# Patient Record
Sex: Female | Born: 1960 | Race: White | Hispanic: No | Marital: Married | Smoking: Never smoker
Health system: Southern US, Community
[De-identification: ages and names within clinical notes are randomized; demographics above are authoritative.]

## PROBLEM LIST (undated history)

## (undated) HISTORY — PX: SHOULDER SURGERY: SHX246

## (undated) HISTORY — PX: KNEE SURGERY: SHX244

## (undated) HISTORY — PX: FRACTURE SURGERY: SHX138

## (undated) HISTORY — PX: BREAST BIOPSY: SHX20

---

## 2004-11-30 ENCOUNTER — Ambulatory Visit: Payer: Self-pay | Admitting: Family Medicine

## 2006-02-18 ENCOUNTER — Ambulatory Visit: Payer: Self-pay | Admitting: Family Medicine

## 2007-05-26 ENCOUNTER — Ambulatory Visit: Payer: Self-pay | Admitting: Family Medicine

## 2007-05-29 ENCOUNTER — Ambulatory Visit: Payer: Self-pay | Admitting: Family Medicine

## 2008-06-03 ENCOUNTER — Ambulatory Visit: Payer: Self-pay | Admitting: Family Medicine

## 2009-06-06 ENCOUNTER — Ambulatory Visit: Payer: Self-pay | Admitting: Family Medicine

## 2010-07-12 ENCOUNTER — Ambulatory Visit: Payer: Self-pay | Admitting: Family Medicine

## 2010-07-17 ENCOUNTER — Ambulatory Visit: Payer: Self-pay | Admitting: Family Medicine

## 2010-08-02 ENCOUNTER — Ambulatory Visit: Payer: Self-pay | Admitting: Urology

## 2010-11-27 ENCOUNTER — Ambulatory Visit: Payer: Self-pay | Admitting: Internal Medicine

## 2011-08-27 ENCOUNTER — Ambulatory Visit: Payer: Self-pay | Admitting: Urology

## 2011-09-11 ENCOUNTER — Ambulatory Visit: Payer: Self-pay

## 2012-09-12 ENCOUNTER — Ambulatory Visit: Payer: Self-pay | Admitting: Nurse Practitioner

## 2012-09-16 ENCOUNTER — Ambulatory Visit: Payer: Self-pay | Admitting: Nurse Practitioner

## 2012-10-23 ENCOUNTER — Ambulatory Visit: Payer: Self-pay | Admitting: Surgery

## 2012-10-31 ENCOUNTER — Ambulatory Visit: Payer: Self-pay | Admitting: Surgery

## 2013-02-25 ENCOUNTER — Ambulatory Visit: Payer: Self-pay | Admitting: Surgery

## 2013-09-17 ENCOUNTER — Ambulatory Visit: Payer: Self-pay | Admitting: Nurse Practitioner

## 2014-08-03 ENCOUNTER — Ambulatory Visit: Payer: Self-pay | Admitting: Podiatry

## 2014-09-20 ENCOUNTER — Ambulatory Visit: Payer: Self-pay | Admitting: Nurse Practitioner

## 2014-12-10 NOTE — Op Note (Signed)
PATIENT NAME:  Neysa BonitoMORY, Raeghan G MR#:  [id number] DATE OF BIRTH:  07-30-1961  DATE OF PROCEDURE:  10/31/2012  PREOPERATIVE DIAGNOSIS: Left breast mass.   POSTOPERATIVE DIAGNOSIS: Left breast mass.   PROCEDURE: Excision of left breast mass.   SURGEON: Adella HareJ. Wilton Smith, MD   ANESTHESIA: General.   INDICATIONS: This 54 year old female recently had ultrasound findings of approximately 7 mm hypoechoic nodule at the 1 o'clock position, retroareolar portion of the left breast, and excisional biopsy was recommended for further study. She did have preoperative ultrasound-guided insertion of Kopans wire. The postprocedure mammogram images were reviewed  PROCEDURE: The patient was placed on the operating table in the supine position under general anesthesia. The dressing was removed from the left breast, exposing the Kopans wire, which was cut 2 cm from the skin. The breast was prepared with ChloraPrep and draped in a sterile manner.   A curvilinear incision was made at the border of the areola from approximately 12 o'clock to 2 o'clock position and carried down through subcutaneous tissues. Several small bleeding points were cauterized. Dissection was carried down to encounter the wire and particularly the thick portion of the wire. A sample of tissue surrounding the distal end of the thick portion of the wire was done. This portion of tissue was approximately 1.5 x 1.5 x 1.5 cm in dimension. There was some mild firmness within the tissue, and it was submitted in formalin for routine pathology. It is noted that during the course of the procedure, a number of small bleeding points were cauterized. One bleeding point medially was suture ligated with 4-0 chromic. The tissues were infiltrated with 0.5% Sensorcaine with epinephrine. Hemostasis was subsequently intact. The wound was closed with a running 4-0 chromic subcuticular suture and Dermabond. The patient tolerated surgery satisfactorily and was then prepared for  transfer to the recovery room.     ____________________________ Shela CommonsJ. Renda RollsWilton Smith, MD jws:OSi D: 10/31/2012 12:36:44 ET T: 11/01/2012 09:47:24 ET JOB#: [id number]  cc: Adella HareJ. Wilton Smith, MD, <Dictator> Adella HareWILTON J SMITH MD ELECTRONICALLY SIGNED 11/02/2012 21:22

## 2016-03-30 ENCOUNTER — Other Ambulatory Visit: Payer: Self-pay | Admitting: Nurse Practitioner

## 2016-03-30 DIAGNOSIS — Z1231 Encounter for screening mammogram for malignant neoplasm of breast: Secondary | ICD-10-CM

## 2016-04-13 ENCOUNTER — Other Ambulatory Visit: Payer: Self-pay | Admitting: Nurse Practitioner

## 2016-04-13 ENCOUNTER — Ambulatory Visit
Admission: RE | Admit: 2016-04-13 | Discharge: 2016-04-13 | Disposition: A | Discharge status: Home / Self Care | Payer: BC Managed Care – PPO | Source: Ambulatory Visit | Attending: Nurse Practitioner | Admitting: Nurse Practitioner

## 2016-04-13 DIAGNOSIS — Z1231 Encounter for screening mammogram for malignant neoplasm of breast: Secondary | ICD-10-CM

## 2017-01-15 ENCOUNTER — Encounter: Payer: Self-pay | Admitting: *Deleted

## 2017-01-15 ENCOUNTER — Ambulatory Visit
Admission: EM | Admit: 2017-01-15 | Discharge: 2017-01-15 | Disposition: A | Discharge status: Home / Self Care | Payer: BC Managed Care – PPO | Attending: Family Medicine | Admitting: Family Medicine

## 2017-01-15 DIAGNOSIS — L299 Pruritus, unspecified: Secondary | ICD-10-CM | POA: Diagnosis not present

## 2017-01-15 DIAGNOSIS — L247 Irritant contact dermatitis due to plants, except food: Secondary | ICD-10-CM | POA: Diagnosis not present

## 2017-01-15 MED ORDER — LORATADINE 10 MG PO TABS: 10.0000 mg | ORAL_TABLET | Freq: Every day | ORAL | 0 refills | Status: AC

## 2017-01-15 MED ORDER — PREDNISONE 10 MG (21) PO TBPK: ORAL_TABLET | ORAL | 0 refills | Status: DC

## 2017-01-15 MED ORDER — CETIRIZINE HCL 10 MG PO TABS: 10.0000 mg | ORAL_TABLET | Freq: Every day | ORAL | 0 refills | Status: AC

## 2017-01-15 NOTE — ED Triage Notes (Signed)
Patient was exposed to poison ivy 1 week ago. OTC medications have not resolved symptoms. Blisters are visible on lower arms and ankles bilateral.

## 2017-01-15 NOTE — ED Provider Notes (Signed)
[facility]    CSN: [id number] Arrival date & time: 01/15/17  0809   Patient   History   Chief Complaint Chief Complaint  Patient presents with  . Poison Ivy    HPI Donna Glover is a 56 y.o. female.   Issue circular rash states rash started last week she thinks is poison oak is on mostly her left arm a few lesions on her right hand and right wrist and on both lower legs. She states very pruritic she's been scratching it and she is quite concerned. She took Benadryl 50 mg last night slept for 2 hours. Pro-she does not have this too often. She's had multiple surgeries tubal ligation knee and shoulder surgery. She has no drug allergies and no chronic medical problems is a history of breast cancer diabetes in the family.   The history is provided by the patient. No language interpreter was used.  Poison [place]  This is a new problem. The current episode started more than 1 week ago. The problem occurs constantly. The problem has been gradually worsening. Pertinent negatives include no chest pain, no abdominal pain, no headaches and no shortness of breath. Nothing aggravates the symptoms. She has tried nothing for the symptoms. The treatment provided no relief.    History reviewed. No pertinent past medical history.  There are no active problems to display for this patient.   Past Surgical History:  Procedure Laterality Date  . BREAST BIOPSY Left    neg  . FRACTURE SURGERY    . KNEE SURGERY Right   . SHOULDER SURGERY Left     OB History    No data available       Home Medications    Prior to Admission medications   Medication Sig Start Date End Date Taking? Authorizing Provider  cetirizine (ZYRTEC) 10 MG tablet Take 1 tablet (10 mg total) by mouth daily. If needed at night for itching not relieved by Claritin in the morning. 01/15/17   Hassan Rowan, MD  loratadine (CLARITIN) 10 MG tablet Take 1 tablet (10 mg total) by mouth daily. Take 1 tablet in the morning.  As needed for itching. 01/15/17   Hassan Rowan, MD  predniSONE (STERAPRED UNI-PAK 21 TAB) 10 MG (21) TBPK tablet 6 tabs day 1 and 2, 5 tabs day 3 and 4, 4 tabs day 5 and 6, 3 tabs day 7 and 8, 2 tabs day 9 and 10, 1 tab day 11 and 12. Take orally 01/15/17   Hassan Rowan, MD    Family History Family History  Problem Relation Age of Onset  . Breast cancer Paternal Grandmother     Social History Social History  Substance Use Topics  . Smoking status: Never Smoker  . Smokeless tobacco: Never Used  . Alcohol use No     Allergies   Patient has no known allergies.   Review of Systems Review of Systems  Respiratory: Negative for shortness of breath.   Cardiovascular: Negative for chest pain.  Gastrointestinal: Negative for abdominal pain.  Skin: Positive for rash.  Neurological: Negative for headaches.  All other systems reviewed and are negative.    Physical Exam Triage Vital Signs ED Triage Vitals  Enc Vitals Group     BP 01/15/17 0829 116/69     Pulse Rate 01/15/17 0829 94     Resp 01/15/17 0829 16     Temp 01/15/17 0829 97.9 F (36.6 C)     Temp Source 01/15/17 0829 Oral  SpO2 01/15/17 0829 100 %     Weight 01/15/17 0830 160 lb (72.6 kg)     Height 01/15/17 0830 5' (1.524 m)     Head Circumference --      Peak Flow --      Pain Score 01/15/17 0830 0     Pain Loc --      Pain Edu? --      Excl. in GC? --    No data found.   Updated Vital Signs BP 116/69 (BP Location: Left Arm)   Pulse 94   Temp 97.9 F (36.6 C) (Oral)   Resp 16   Ht 5' (1.524 m)   Wt 160 lb (72.6 kg)   SpO2 100%   BMI 31.25 kg/m   Visual Acuity Right Eye Distance:   Left Eye Distance:   Bilateral Distance:    Right Eye Near:   Left Eye Near:    Bilateral Near:     Physical Exam  Constitutional: She is oriented to person, place, and time. She appears well-developed and well-nourished.  HENT:  Head: Normocephalic and atraumatic.  Eyes: EOM are normal. Pupils are equal,  round, and reactive to light.  Neck: Normal range of motion.  Pulmonary/Chest: Effort normal.  Musculoskeletal: Normal range of motion.  Neurological: She is alert and oriented to person, place, and time.  Skin: Rash noted. There is erythema.     Psychiatric: She has a normal mood and affect. Her behavior is normal.  Vitals reviewed.    UC Treatments / Results  Labs (all labs ordered are listed, but only abnormal results are displayed) Labs Reviewed - No data to display  EKG  EKG Interpretation None       Radiology No results found.  Procedures Procedures (including critical care time)  Medications Ordered in UC Medications - No data to display   Initial Impression / Assessment and Plan / UC Course  I have reviewed the triage vital signs and the nursing notes.  Pertinent labs & imaging results that were available during my care of the patient were reviewed by me and considered in my medical decision making (see chart for details). We will place patient on 12 day course of prednisone and Claritin the morning 10 mg Zyrtec 10 mg at night also gave her offered her a work note which she declined since she retired. Also since low concerned it may be 2 different etiology both poison ivy and something else causing the pruritus but will first treat the contact dermatitis    Final Clinical Impressions(s) / UC Diagnoses   Final diagnoses:  Irritant contact dermatitis due to plants, except food  Itching    New Prescriptions New Prescriptions   CETIRIZINE (ZYRTEC) 10 MG TABLET    Take 1 tablet (10 mg total) by mouth daily. If needed at night for itching not relieved by Claritin in the morning.   LORATADINE (CLARITIN) 10 MG TABLET    Take 1 tablet (10 mg total) by mouth daily. Take 1 tablet in the morning. As needed for itching.   PREDNISONE (STERAPRED UNI-PAK 21 TAB) 10 MG (21) TBPK TABLET    6 tabs day 1 and 2, 5 tabs day 3 and 4, 4 tabs day 5 and 6, 3 tabs day 7 and 8, 2  tabs day 9 and 10, 1 tab day 11 and 12. Take orally     Note: This dictation was prepared with Dragon dictation along with smaller phrase technology. Any transcriptional errors that  result from this process are unintentional.   Hassan RowanWade, Mireya Meditz, MD 01/15/17 [phone]

## 2017-03-06 ENCOUNTER — Other Ambulatory Visit: Payer: Self-pay | Admitting: Nurse Practitioner

## 2017-03-06 DIAGNOSIS — Z1231 Encounter for screening mammogram for malignant neoplasm of breast: Secondary | ICD-10-CM

## 2017-04-15 ENCOUNTER — Ambulatory Visit
Admission: RE | Admit: 2017-04-15 | Discharge: 2017-04-15 | Disposition: A | Discharge status: Home / Self Care | Payer: BC Managed Care – PPO | Source: Ambulatory Visit | Attending: Nurse Practitioner | Admitting: Nurse Practitioner

## 2017-04-15 DIAGNOSIS — Z1231 Encounter for screening mammogram for malignant neoplasm of breast: Secondary | ICD-10-CM | POA: Diagnosis present

## 2017-04-29 ENCOUNTER — Ambulatory Visit
Admission: EM | Admit: 2017-04-29 | Discharge: 2017-04-29 | Disposition: A | Discharge status: Home / Self Care | Payer: BC Managed Care – PPO | Attending: Family Medicine | Admitting: Family Medicine

## 2017-04-29 DIAGNOSIS — S39012A Strain of muscle, fascia and tendon of lower back, initial encounter: Secondary | ICD-10-CM

## 2017-04-29 MED ORDER — CYCLOBENZAPRINE HCL 10 MG PO TABS: 10.0000 mg | ORAL_TABLET | Freq: Every day | ORAL | 0 refills | Status: DC

## 2017-04-29 MED ORDER — HYDROCODONE-ACETAMINOPHEN 5-325 MG PO TABS: ORAL_TABLET | ORAL | 0 refills | Status: AC

## 2017-04-29 NOTE — ED Triage Notes (Signed)
Patient complains of left lower back pain that started after picking up a big tote. Patient states that she pulled something in this area and required assistance standing back up.

## 2017-04-29 NOTE — ED Provider Notes (Signed)
[facility]    CSN: [id number] Arrival date & time: 04/29/17  1619     History   Chief Complaint Chief Complaint  Patient presents with  . Back Pain    left    HPI Donna Glover is a 56 y.o. female.   56 yo female with a c/o left lower back pain that started today when she was picking up a heavy tote. States she felt like something pulled in the area. Denies pain radiating or any numbness/tingling, bowel or bladder problems.    The history is provided by the patient.    History reviewed. No pertinent past medical history.  There are no active problems to display for this patient.   Past Surgical History:  Procedure Laterality Date  . BREAST BIOPSY Left    neg  . FRACTURE SURGERY    . KNEE SURGERY Right   . SHOULDER SURGERY Left     OB History    No data available       Home Medications    Prior to Admission medications   Medication Sig Start Date End Date Taking? Authorizing Provider  cetirizine (ZYRTEC) 10 MG tablet Take 1 tablet (10 mg total) by mouth daily. If needed at night for itching not relieved by Claritin in the morning. 01/15/17   Hassan Rowan, MD  cyclobenzaprine (FLEXERIL) 10 MG tablet Take 1 tablet (10 mg total) by mouth at bedtime. 04/29/17   Payton Mccallum, MD  HYDROcodone-acetaminophen (NORCO/VICODIN) 5-325 MG tablet 1-2 tabs po qd prn 04/29/17   Payton Mccallum, MD  loratadine (CLARITIN) 10 MG tablet Take 1 tablet (10 mg total) by mouth daily. Take 1 tablet in the morning. As needed for itching. 01/15/17   Hassan Rowan, MD  predniSONE (STERAPRED UNI-PAK 21 TAB) 10 MG (21) TBPK tablet 6 tabs day 1 and 2, 5 tabs day 3 and 4, 4 tabs day 5 and 6, 3 tabs day 7 and 8, 2 tabs day 9 and 10, 1 tab day 11 and 12. Take orally 01/15/17   Hassan Rowan, MD    Family History Family History  Problem Relation Age of Onset  . Breast cancer Paternal Grandmother     Social History Social History  Substance Use Topics  . Smoking status: Never Smoker   . Smokeless tobacco: Never Used  . Alcohol use No     Allergies   Patient has no known allergies.   Review of Systems Review of Systems   Physical Exam Triage Vital Signs ED Triage Vitals  Enc Vitals Group     BP 04/29/17 1727 132/73     Pulse Rate 04/29/17 1727 66     Resp 04/29/17 1727 16     Temp 04/29/17 1727 98.2 F (36.8 C)     Temp Source 04/29/17 1727 Oral     SpO2 04/29/17 1727 100 %     Weight 04/29/17 1726 160 lb (72.6 kg)     Height 04/29/17 1726 5' (1.524 m)     Head Circumference --      Peak Flow --      Pain Score 04/29/17 1726 6     Pain Loc --      Pain Edu? --      Excl. in GC? --    No data found.   Updated Vital Signs BP 132/73 (BP Location: Left Arm)   Pulse 66   Temp 98.2 F (36.8 C) (Oral)   Resp 16   Ht 5' (1.524  m)   Wt 160 lb (72.6 kg)   SpO2 100%   BMI 31.25 kg/m   Visual Acuity Right Eye Distance:   Left Eye Distance:   Bilateral Distance:    Right Eye Near:   Left Eye Near:    Bilateral Near:     Physical Exam  Constitutional: She appears well-developed and well-nourished. No distress.  Musculoskeletal:       Lumbar back: She exhibits tenderness (left lumbar paraspinous muscles). She exhibits normal range of motion, no bony tenderness, no swelling, no edema, no deformity, no laceration, no pain, no spasm and normal pulse.  Skin: She is not diaphoretic.  Nursing note and vitals reviewed.    UC Treatments / Results  Labs (all labs ordered are listed, but only abnormal results are displayed) Labs Reviewed - No data to display  EKG  EKG Interpretation None       Radiology No results found.  Procedures Procedures (including critical care time)  Medications Ordered in UC Medications - No data to display   Initial Impression / Assessment and Plan / UC Course  I have reviewed the triage vital signs and the nursing notes.  Pertinent labs & imaging results that were available during my care of the patient  were reviewed by me and considered in my medical decision making (see chart for details).       Final Clinical Impressions(s) / UC Diagnoses   Final diagnoses:  Strain of lumbar region, initial encounter    New Prescriptions Discharge Medication List as of 04/29/2017  5:44 PM    START taking these medications   Details  cyclobenzaprine (FLEXERIL) 10 MG tablet Take 1 tablet (10 mg total) by mouth at bedtime., Starting Mon 04/29/2017, Normal    HYDROcodone-acetaminophen (NORCO/VICODIN) 5-325 MG tablet 1-2 tabs po qd prn, Print       1. diagnosis reviewed with patient 2. rx as per orders above; reviewed possible side effects, interactions, risks and benefits  3. Recommend supportive treatment with rest, heat, gentle stretching 4. Follow-up prn if symptoms worsen or don't improve  Controlled Substance Prescriptions [place] Controlled Substance Registry consulted? No   Payton Mccallumonty, Mcclellan Demarais, MD 04/29/17 1950

## 2018-07-15 ENCOUNTER — Other Ambulatory Visit: Payer: Self-pay | Admitting: Nurse Practitioner

## 2018-07-15 DIAGNOSIS — Z1231 Encounter for screening mammogram for malignant neoplasm of breast: Secondary | ICD-10-CM

## 2018-08-28 ENCOUNTER — Ambulatory Visit
Admission: RE | Admit: 2018-08-28 | Discharge: 2018-08-28 | Disposition: A | Discharge status: Home / Self Care | Payer: BC Managed Care – PPO | Source: Ambulatory Visit | Attending: Nurse Practitioner | Admitting: Nurse Practitioner

## 2018-08-28 DIAGNOSIS — Z1231 Encounter for screening mammogram for malignant neoplasm of breast: Secondary | ICD-10-CM | POA: Diagnosis present

## 2019-07-20 ENCOUNTER — Other Ambulatory Visit: Payer: Self-pay | Admitting: Nurse Practitioner

## 2019-07-20 DIAGNOSIS — Z1231 Encounter for screening mammogram for malignant neoplasm of breast: Secondary | ICD-10-CM

## 2019-09-03 ENCOUNTER — Ambulatory Visit
Admission: RE | Admit: 2019-09-03 | Discharge: 2019-09-03 | Disposition: A | Discharge status: Home / Self Care | Payer: BC Managed Care – PPO | Source: Ambulatory Visit | Attending: Nurse Practitioner | Admitting: Nurse Practitioner

## 2019-09-03 DIAGNOSIS — Z1231 Encounter for screening mammogram for malignant neoplasm of breast: Secondary | ICD-10-CM

## 2020-07-20 ENCOUNTER — Other Ambulatory Visit: Payer: Self-pay | Admitting: Nurse Practitioner

## 2020-07-20 DIAGNOSIS — Z1231 Encounter for screening mammogram for malignant neoplasm of breast: Secondary | ICD-10-CM

## 2020-09-07 ENCOUNTER — Other Ambulatory Visit: Payer: Self-pay

## 2020-09-07 ENCOUNTER — Ambulatory Visit
Admission: RE | Admit: 2020-09-07 | Discharge: 2020-09-07 | Disposition: A | Discharge status: Home / Self Care | Payer: BC Managed Care – PPO | Source: Ambulatory Visit | Attending: Nurse Practitioner | Admitting: Nurse Practitioner

## 2020-09-07 DIAGNOSIS — Z1231 Encounter for screening mammogram for malignant neoplasm of breast: Secondary | ICD-10-CM | POA: Insufficient documentation

## 2021-07-20 ENCOUNTER — Other Ambulatory Visit: Payer: Self-pay | Admitting: Nurse Practitioner

## 2021-07-20 DIAGNOSIS — Z1231 Encounter for screening mammogram for malignant neoplasm of breast: Secondary | ICD-10-CM

## 2021-08-23 LAB — EXTERNAL GENERIC LAB PROCEDURE: COLOGUARD: NEGATIVE

## 2021-08-23 LAB — COLOGUARD: COLOGUARD: NEGATIVE

## 2021-10-09 ENCOUNTER — Ambulatory Visit
Admission: RE | Admit: 2021-10-09 | Discharge: 2021-10-09 | Disposition: A | Discharge status: Home / Self Care | Payer: BC Managed Care – PPO | Source: Ambulatory Visit | Attending: Nurse Practitioner | Admitting: Nurse Practitioner

## 2021-10-09 ENCOUNTER — Other Ambulatory Visit: Payer: Self-pay

## 2021-10-09 DIAGNOSIS — Z1231 Encounter for screening mammogram for malignant neoplasm of breast: Secondary | ICD-10-CM | POA: Insufficient documentation

## 2021-12-25 IMAGING — MG DIGITAL SCREENING BILAT W/ TOMO W/ CAD
8 series · 8 of 24 positions shown · non-contrast
Comparison: Previous exam(s).

CLINICAL DATA: Screening.

EXAM:
DIGITAL SCREENING BILATERAL MAMMOGRAM WITH TOMO AND CAD

[L MLO synth-2D]
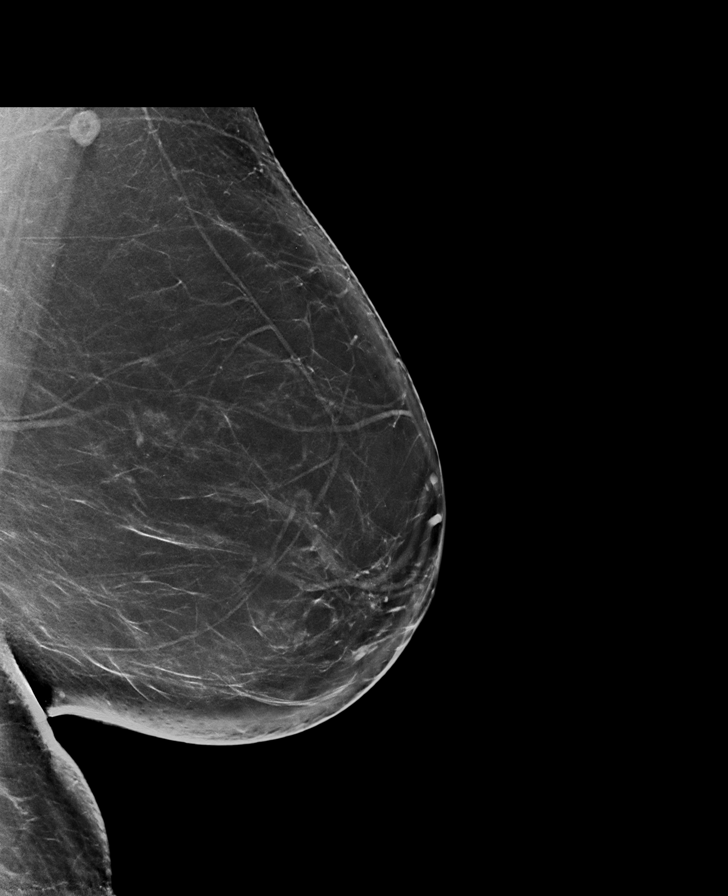

[R MLO synth-2D]
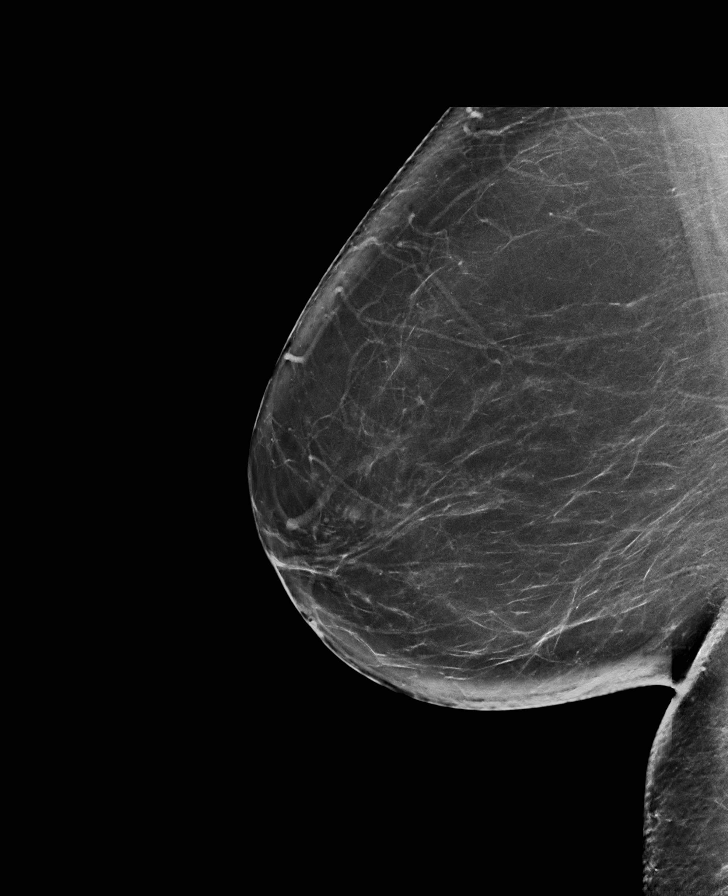

[R CC synth-2D]
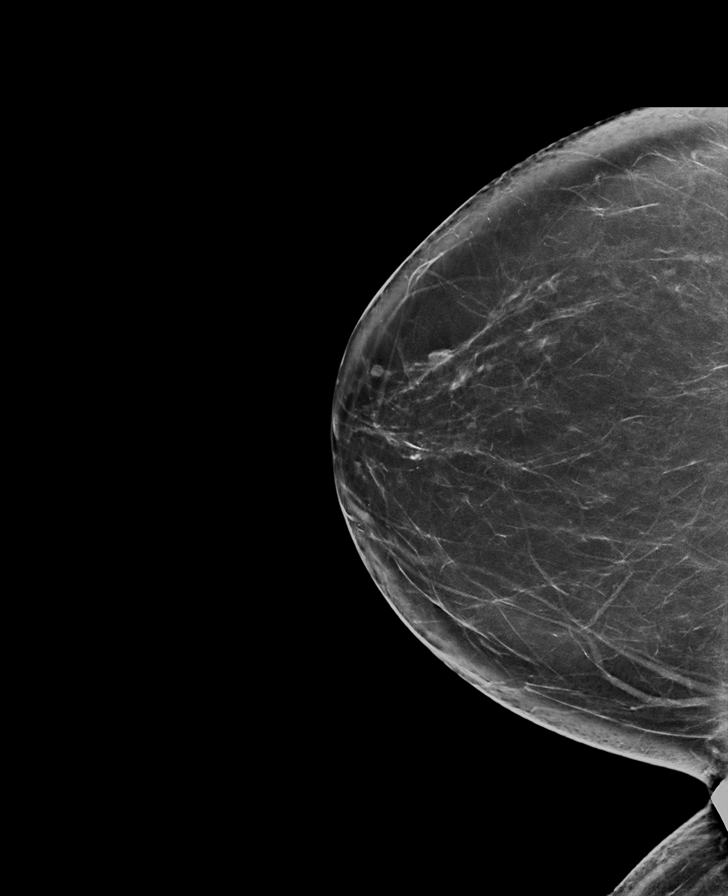

[L CC synth-2D]
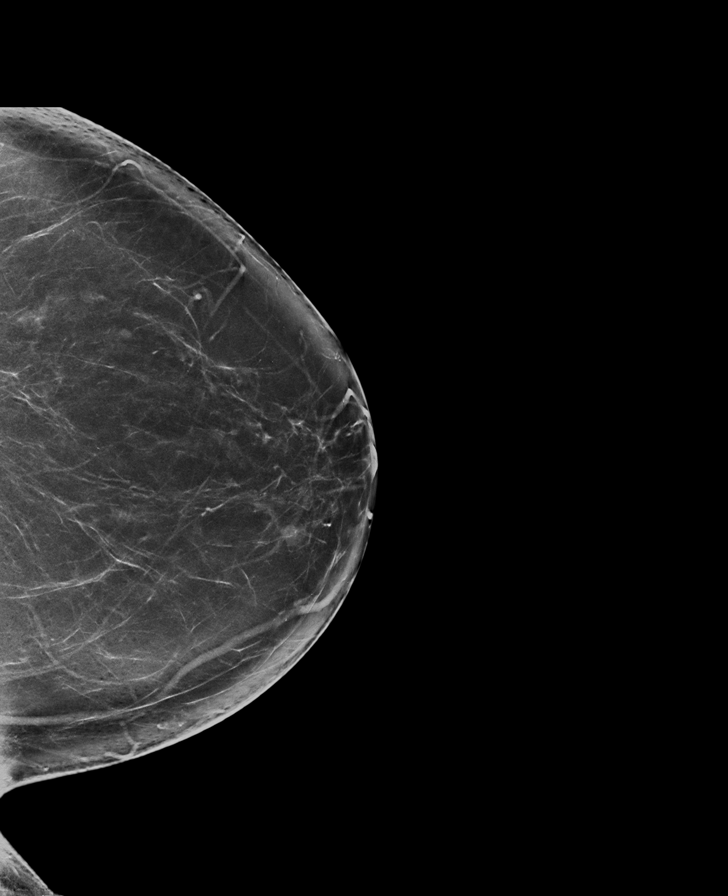

[R CC tomo · tomo slice 43/85.0]
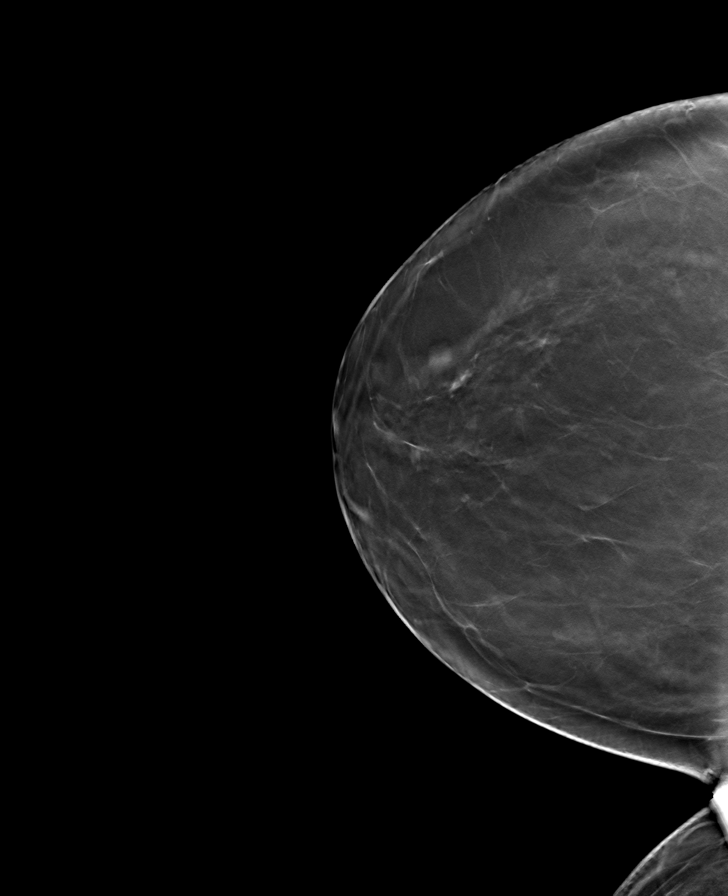

[L CC tomo · tomo slice 43/86.0]
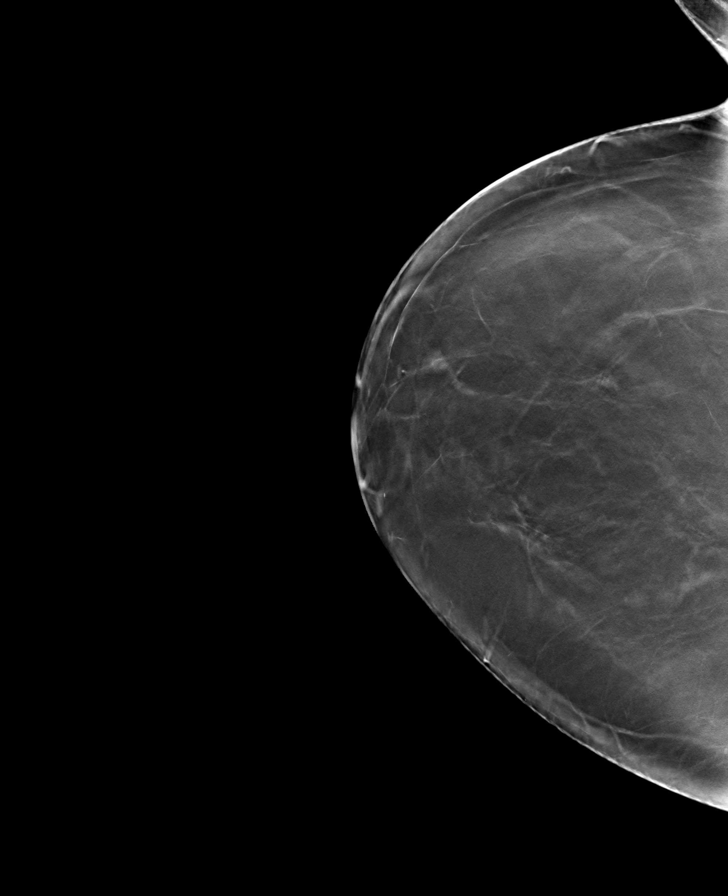

[L MLO tomo · tomo slice 45/89.0]
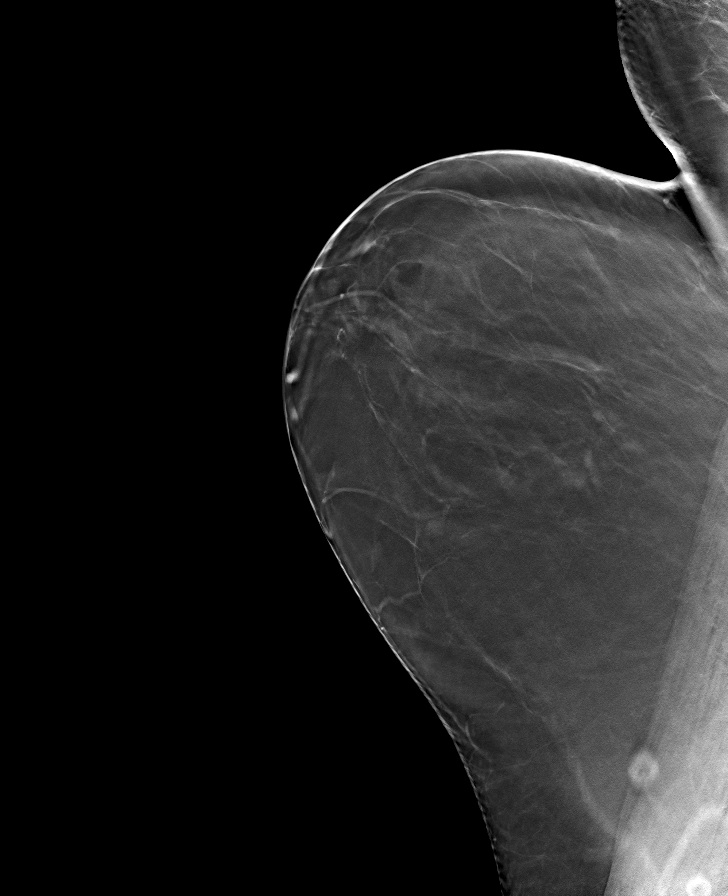

[R MLO tomo · tomo slice 46/91.0]
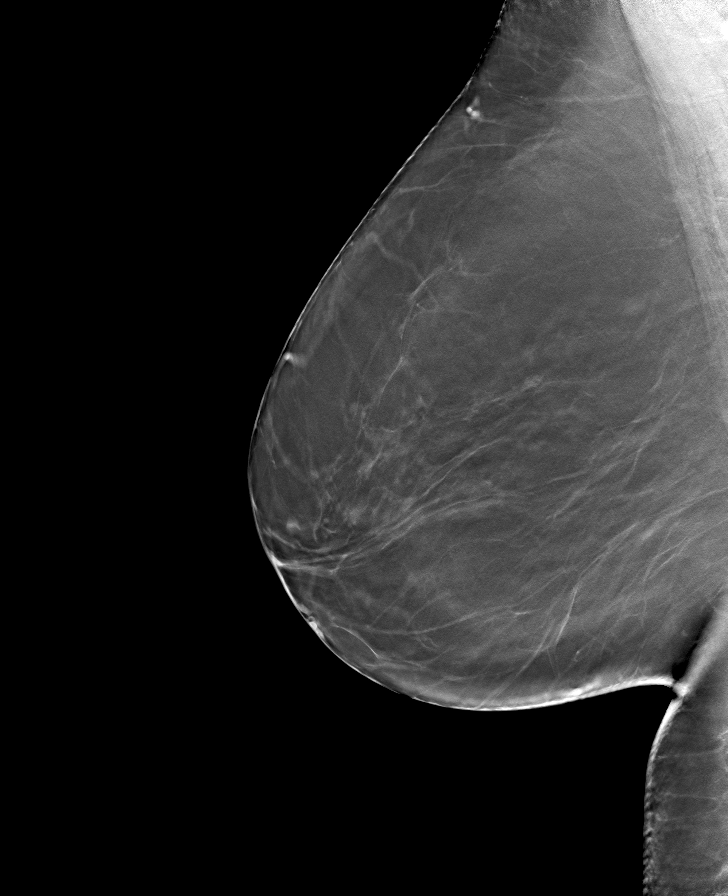

[8 of 24 positions shown; findings below may reference images not displayed]

ACR Breast Density Category b: There are scattered areas of
fibroglandular density.
FINDINGS: There are no findings suspicious for malignancy. Images were
processed with CAD.
IMPRESSION: No mammographic evidence of malignancy. A result letter of this
screening mammogram will be mailed directly to the patient.

RECOMMENDATION:
Screening mammogram in one year. (Code:[id number]-775)

BI-RADS CATEGORY  1: Negative.

## 2022-07-24 ENCOUNTER — Other Ambulatory Visit: Payer: Self-pay | Admitting: Nurse Practitioner

## 2022-07-24 DIAGNOSIS — Z1231 Encounter for screening mammogram for malignant neoplasm of breast: Secondary | ICD-10-CM

## 2022-10-10 ENCOUNTER — Ambulatory Visit
Admission: RE | Admit: 2022-10-10 | Discharge: 2022-10-10 | Disposition: A | Discharge status: Home / Self Care | Payer: BC Managed Care – PPO | Source: Ambulatory Visit | Attending: Nurse Practitioner | Admitting: Nurse Practitioner

## 2022-10-10 DIAGNOSIS — Z1231 Encounter for screening mammogram for malignant neoplasm of breast: Secondary | ICD-10-CM | POA: Insufficient documentation

## 2022-10-12 ENCOUNTER — Ambulatory Visit (INDEPENDENT_AMBULATORY_CARE_PROVIDER_SITE_OTHER): Payer: BC Managed Care – PPO

## 2022-10-12 ENCOUNTER — Ambulatory Visit
Admission: EM | Admit: 2022-10-12 | Discharge: 2022-10-12 | Disposition: A | Discharge status: Home / Self Care | Payer: BC Managed Care – PPO | Attending: Family Medicine | Admitting: Family Medicine

## 2022-10-12 DIAGNOSIS — M79671 Pain in right foot: Secondary | ICD-10-CM | POA: Diagnosis not present

## 2022-10-12 NOTE — ED Provider Notes (Signed)
[facility]    CSN: [id number] Arrival date & time: 10/12/22  1344      History   Chief Complaint Chief Complaint  Patient presents with   Foot Pain    HPI  HPI Donna Glover is a 62 y.o. female.   Donna Glover presents for right foot pain.  Reports about a month ago she was walking down the stairs twisted her right ankle.  She had immediate knee pain but then afterwards she started having right foot pain.  The foot pain is on the lateral midfoot.  Pain is described as "compression-like."  She took some ketorolac this morning which helped her symptoms.  Pain does not radiate into her ankle or her leg.  She does not recall any abnormal pops or sounds.  Pain is worse with walking.  She has previously fractured her ankle on that side.   History reviewed. No pertinent past medical history.  There are no problems to display for this patient.   Past Surgical History:  Procedure Laterality Date   BREAST BIOPSY Left    neg   FRACTURE SURGERY     KNEE SURGERY Right    SHOULDER SURGERY Left     OB History   No obstetric history on file.      Home Medications    Prior to Admission medications   Medication Sig Start Date End Date Taking? Authorizing Provider  ketorolac (TORADOL) 10 MG tablet Take 10 mg by mouth every 6 (six) hours as needed.   Yes [provider]  CALCIUM-VITAMIN D PO Take 1 tablet by mouth daily.    [provider]  cetirizine (ZYRTEC) 10 MG tablet Take 1 tablet (10 mg total) by mouth daily. If needed at night for itching not relieved by Claritin in the morning. 01/15/17   Frederich Cha, MD  cyclobenzaprine (FLEXERIL) 10 MG tablet Take 1 tablet (10 mg total) by mouth at bedtime. 04/29/17   Norval Gable, MD  HYDROcodone-acetaminophen (NORCO/VICODIN) 5-325 MG tablet 1-2 tabs po qd prn 04/29/17   Norval Gable, MD  loratadine (CLARITIN) 10 MG tablet Take 1 tablet (10 mg total) by mouth daily. Take 1 tablet in the morning. As needed for  itching. 01/15/17   Frederich Cha, MD  predniSONE (STERAPRED UNI-PAK 21 TAB) 10 MG (21) TBPK tablet 6 tabs day 1 and 2, 5 tabs day 3 and 4, 4 tabs day 5 and 6, 3 tabs day 7 and 8, 2 tabs day 9 and 10, 1 tab day 11 and 12. Take orally 01/15/17   Frederich Cha, MD    Family History Family History  Problem Relation Age of Onset   Breast cancer Paternal Grandmother     Social History Social History   Tobacco Use   Smoking status: Never   Smokeless tobacco: Never  Vaping Use   Vaping Use: Never used  Substance Use Topics   Alcohol use: No   Drug use: No     Allergies   Patient has no known allergies.   Review of Systems Review of Systems: :negative unless otherwise stated in HPI.      Physical Exam Triage Vital Signs ED Triage Vitals  Enc Vitals Group     BP      Pulse      Resp      Temp      Temp src      SpO2      Weight      Height  Head Circumference      Peak Flow      Pain Score      Pain Loc      Pain Edu?      Excl. in [place]?    No data found.  Updated Vital Signs BP 128/82 (BP Location: Left Arm)   Pulse 70   Temp 98 F (36.7 C) (Oral)   Ht '4\' 11"'$  (1.499 m)   Wt 90.7 kg   SpO2 94%   BMI 40.40 kg/m   Visual Acuity Right Eye Distance:   Left Eye Distance:   Bilateral Distance:    Right Eye Near:   Left Eye Near:    Bilateral Near:     Physical Exam GEN: well appearing female in no acute distress  CVS: well perfused  RESP: speaking in full sentences without pause, no respiratory distress  MSK:   Ankle/Foot, Right: TTP noted at the 4th base metatarsal extended towards the cuboid. No visible erythema, swelling, ecchymosis, or bony deformity. Transverse arch grossly intact; No evidence of tibiotalar deviation; Range of motion is full in all directions. Strength is 5/5 in all directions. No tenderness at the insertion/body/myotendinous junction of the Achilles tendon;  No tenderness on posterior aspects of lateral and medial malleolus; Talar  dome nontender; Unremarkable calcaneal squeeze; No plantar calcaneal tenderness; No tenderness over the navicular prominence; Able to walk 4 steps.     UC Treatments / Results  Labs (all labs ordered are listed, but only abnormal results are displayed) Labs Reviewed - No data to display  EKG   Radiology DG Foot Complete Right  Result Date: 10/12/2022 CLINICAL DATA:  Right foot pain for a week EXAM: RIGHT FOOT COMPLETE - 3 VIEW COMPARISON:  None Available. FINDINGS: There is no evidence of fracture or dislocation. There is no evidence of arthropathy or other focal bone abnormality. Soft tissues are unremarkable. Small plantar well corticated calcaneal spur IMPRESSION: No acute osseous abnormality Electronically Signed   By: Jill Side M.D.   On: 10/12/2022 14:27    Procedures Procedures (including critical care time)  Medications Ordered in UC Medications - No data to display  Initial Impression / Assessment and Plan / UC Course  I have reviewed the triage vital signs and the nursing notes.  Pertinent labs & imaging results that were available during my care of the patient were reviewed by me and considered in my medical decision making (see chart for details).      Pt is a 62 y.o.  female with right foot pain after twisting her ankle a month ago.   Obtained right foot plain films.  I am unable to see the images at this time. Radiologist notes  no soft tissue swelling, fractures or dislocations. Given her ongoing pain, I remain worried about a stress fracture or ligamental injury.  Recommended she wear her walking boot that she had at home. She is to follow up with orthopedic surgery for additional evaluation.  Continue home Ketoralac tabs and Tylenol PRN.  Patient to gradually return to normal activities, as tolerated and continue ordinary activities within the limits permitted by pain.   Patient to follow up with orthopedic surgery.  Return and ED precautions given. Understanding  voiced. Discussed MDM, treatment plan and plan for follow-up with patient who agrees with plan.   Final Clinical Impressions(s) / UC Diagnoses   Final diagnoses:  Foot pain, right     Discharge Instructions      Your xrays did not  show a fracture or dislocation. Follow up with orthopedic surgery for further evaluation of your foot pain. I can not safely rule out a stress fracture with xray at this time. Continue taking Ketoralac and Tylenol as needed for pain. I recommend wearing your walking boot until you are seen by orthopedic surgery.      ED Prescriptions   None    PDMP not reviewed this encounter.   Lyndee Hensen, DO 10/12/22 1437

## 2022-10-12 NOTE — ED Triage Notes (Signed)
Pt c/o right foot pain x1week  Pt states that her foot hurts most in the afternoon and when waking up. She has pain along the top of the lateral side of the foot from toe to ankle. It does hurt at rest but there is pain when walking.  Pt denies any bruising, numbness, tingling, leg pain, toe pain, or ankle pain.  When moving her foot, pt feels the top of her foot pulling and has pain.   Pt states that she rolled her ankle a month ago and new pain started within the week while walking. Pt denies any falls or injury to her foot in the last week.   Pt has taken Ketoralac for her pain

## 2022-10-12 NOTE — Discharge Instructions (Addendum)
Your xrays did not show a fracture or dislocation. Follow up with orthopedic surgery for further evaluation of your foot pain. I can not safely rule out a stress fracture with xray at this time. Continue taking Ketoralac and Tylenol as needed for pain. I recommend wearing your walking boot until you are seen by orthopedic surgery.

## 2023-06-23 ENCOUNTER — Encounter: Payer: Self-pay | Admitting: Emergency Medicine

## 2023-06-23 ENCOUNTER — Ambulatory Visit
Admission: EM | Admit: 2023-06-23 | Discharge: 2023-06-23 | Disposition: A | Discharge status: Home / Self Care | Payer: BC Managed Care – PPO | Attending: Family Medicine | Admitting: Family Medicine

## 2023-06-23 DIAGNOSIS — J039 Acute tonsillitis, unspecified: Secondary | ICD-10-CM | POA: Insufficient documentation

## 2023-06-23 LAB — GROUP A STREP BY PCR: Group A Strep by PCR: NOT DETECTED

## 2023-06-23 MED ORDER — AMOXICILLIN-POT CLAVULANATE 875-125 MG PO TABS: 1.0000 | ORAL_TABLET | Freq: Two times a day (BID) | ORAL | 0 refills | Status: AC

## 2023-06-23 NOTE — ED Triage Notes (Signed)
Patient c/o sore throat and swollen tonsils for a week.  Patient reports low grade fevers.

## 2023-06-23 NOTE — ED Provider Notes (Signed)
[facility]    CSN: [id number] Arrival date & time: 06/23/23  1012      History   Chief Complaint Chief Complaint  Patient presents with   Sore Throat    HPI Donna Glover is a 62 y.o. female.   HPI  History obtained from the patient. [name] presents for sore throat for the past week. Has some swelling in her tonsils. Has progressively lost her voice over the past weekend. Has cough and ear pressure. Her husband had sore throat but he is better now.  Home COVID tests were negative x 2.     History reviewed. No pertinent past medical history.  There are no problems to display for this patient.   Past Surgical History:  Procedure Laterality Date   BREAST BIOPSY Left    neg   FRACTURE SURGERY     KNEE SURGERY Right    SHOULDER SURGERY Left     OB History   No obstetric history on file.      Home Medications    Prior to Admission medications   Medication Sig Start Date End Date Taking? Authorizing Provider  amoxicillin-clavulanate (AUGMENTIN) 875-125 MG tablet Take 1 tablet by mouth every 12 (twelve) hours. 06/23/23  Yes Adwoa Axe, DO  CALCIUM-VITAMIN D PO Take 1 tablet by mouth daily.    [provider]  cetirizine (ZYRTEC) 10 MG tablet Take 1 tablet (10 mg total) by mouth daily. If needed at night for itching not relieved by Claritin in the morning. 01/15/17   Hassan Rowan, MD  HYDROcodone-acetaminophen (NORCO/VICODIN) 5-325 MG tablet 1-2 tabs po qd prn 04/29/17   Payton Mccallum, MD  ketorolac (TORADOL) 10 MG tablet Take 10 mg by mouth every 6 (six) hours as needed.    [provider]  loratadine (CLARITIN) 10 MG tablet Take 1 tablet (10 mg total) by mouth daily. Take 1 tablet in the morning. As needed for itching. 01/15/17   Hassan Rowan, MD    Family History Family History  Problem Relation Age of Onset   Breast cancer Paternal Grandmother     Social History Social History   Tobacco Use   Smoking status: Never    Smokeless tobacco: Never  Vaping Use   Vaping status: Never Used  Substance Use Topics   Alcohol use: No   Drug use: No     Allergies   Patient has no known allergies.   Review of Systems Review of Systems: negative unless otherwise stated in HPI.      Physical Exam Triage Vital Signs ED Triage Vitals  Encounter Vitals Group     BP 06/23/23 1111 120/68     Systolic BP Percentile --      Diastolic BP Percentile --      Pulse Rate 06/23/23 1111 77     Resp 06/23/23 1111 14     Temp 06/23/23 1111 98.3 F (36.8 C)     Temp Source 06/23/23 1111 Oral     SpO2 06/23/23 1111 100 %     Weight 06/23/23 1109 199 lb 15.3 oz (90.7 kg)     Height 06/23/23 1109 4\' 11"  (1.499 m)     Head Circumference --      Peak Flow --      Pain Score 06/23/23 1109 4     Pain Loc --      Pain Education --      Exclude from Growth Chart --    No data found.  Updated Vital Signs BP 120/68 (BP Location: Left Arm)   Pulse 77   Temp 98.3 F (36.8 C) (Oral)   Resp 14   Ht 4\' 11"  (1.499 m)   Wt 90.7 kg   SpO2 100%   BMI 40.39 kg/m   Visual Acuity Right Eye Distance:   Left Eye Distance:   Bilateral Distance:    Right Eye Near:   Left Eye Near:    Bilateral Near:     Physical Exam GEN:     alert, non-toxic appearing older female  HENT:  mucus membranes moist, oropharyngeal without lesions, moderate erythema, 2+ tonsillar hypertrophy but no exudates, clear nasal discharge, bilateral TM normal EYES:   pupils equal and reactive, no scleral injection or discharge NECK:  normal ROM, bilateral anterior and posterior lymphadenopathy, no meningismus   RESP:  no increased work of breathing, clear to auscultation bilaterally CVS:   regular rate and rhythm Skin:   warm and dry, no rash on visible skin    UC Treatments / Results  Labs (all labs ordered are listed, but only abnormal results are displayed) Labs Reviewed  GROUP A STREP BY PCR    EKG   Radiology No results  found.  Procedures Procedures (including critical care time)  Medications Ordered in UC Medications - No data to display  Initial Impression / Assessment and Plan / UC Course  I have reviewed the triage vital signs and the nursing notes.  Pertinent labs & imaging results that were available during my care of the patient were reviewed by me and considered in my medical decision making (see chart for details).       Pt is a 62 y.o. female who presents for 7 days of respiratory symptoms. [name] is afebrile here without recent antipyretics. Satting well on room air. Overall pt is non-toxic appearing, well hydrated, without respiratory distress.  Home COVID test is negative.  Discussed symptomatic treatment.  Suspect viral tonsillitis however patient is very concerned that she has a bacterial infection.  We discussed delayed use of antibiotics.  Prescribed azithromycin if not improving by day 10.  Typical duration of symptoms discussed.   Return and ED precautions given and voiced understanding. Discussed MDM, treatment plan and plan for follow-up with patient who agrees with plan.     Final Clinical Impressions(s) / UC Diagnoses   Final diagnoses:  Acute tonsillitis, unspecified etiology     Discharge Instructions      Stop by the pharmacy to pick up your prescriptions.  Follow up with your primary care provider as needed.      ED Prescriptions     Medication Sig Dispense Auth. Provider   amoxicillin-clavulanate (AUGMENTIN) 875-125 MG tablet Take 1 tablet by mouth every 12 (twelve) hours. 14 tablet Alexandros Ewan, Seward Meth, DO      PDMP not reviewed this encounter.   Katha Cabal, DO 06/23/23 1308

## 2023-06-23 NOTE — Discharge Instructions (Signed)
Stop by the pharmacy to pick up your prescriptions.  Follow up with your primary care provider as needed.  

## 2023-07-29 ENCOUNTER — Other Ambulatory Visit: Payer: Self-pay | Admitting: Nurse Practitioner

## 2023-07-29 DIAGNOSIS — Z1231 Encounter for screening mammogram for malignant neoplasm of breast: Secondary | ICD-10-CM

## 2023-10-14 ENCOUNTER — Ambulatory Visit
Admission: RE | Admit: 2023-10-14 | Discharge: 2023-10-14 | Disposition: A | Discharge status: Home / Self Care | Payer: 59 | Source: Ambulatory Visit | Attending: Nurse Practitioner | Admitting: Nurse Practitioner

## 2023-10-14 DIAGNOSIS — Z1231 Encounter for screening mammogram for malignant neoplasm of breast: Secondary | ICD-10-CM | POA: Diagnosis present

## 2024-07-29 ENCOUNTER — Other Ambulatory Visit: Payer: Self-pay | Admitting: Nurse Practitioner

## 2024-07-29 DIAGNOSIS — Z1231 Encounter for screening mammogram for malignant neoplasm of breast: Secondary | ICD-10-CM

## 2024-08-24 LAB — COLOGUARD: COLOGUARD: NEGATIVE

## 2024-10-14 ENCOUNTER — Encounter
